# Patient Record
Sex: Female | Born: 1950 | Race: Black or African American | Hispanic: No | Marital: Married | Smoking: Never smoker
Health system: Southern US, Community
[De-identification: ages and names within clinical notes are randomized; demographics above are authoritative.]

## PROBLEM LIST (undated history)

## (undated) DIAGNOSIS — I10 Essential (primary) hypertension: Secondary | ICD-10-CM

## (undated) DIAGNOSIS — E119 Type 2 diabetes mellitus without complications: Secondary | ICD-10-CM

## (undated) HISTORY — DX: Type 2 diabetes mellitus without complications: E11.9

## (undated) HISTORY — PX: ABDOMINAL HYSTERECTOMY: SHX81

## (undated) HISTORY — DX: Essential (primary) hypertension: I10

---

## 1998-06-23 ENCOUNTER — Encounter: Admission: RE | Admit: 1998-06-23 | Discharge: 1998-09-21 | Payer: Self-pay | Admitting: Family Medicine

## 2000-05-02 ENCOUNTER — Ambulatory Visit (HOSPITAL_COMMUNITY): Admission: RE | Admit: 2000-05-02 | Discharge: 2000-05-02 | Payer: Self-pay | Admitting: Family Medicine

## 2000-05-02 ENCOUNTER — Encounter: Payer: Self-pay | Admitting: Family Medicine

## 2005-06-15 ENCOUNTER — Encounter: Admission: RE | Admit: 2005-06-15 | Discharge: 2005-06-15 | Payer: Self-pay | Admitting: Sports Medicine

## 2007-02-21 ENCOUNTER — Ambulatory Visit: Payer: Self-pay | Admitting: Surgery

## 2007-02-21 ENCOUNTER — Ambulatory Visit: Admission: RE | Admit: 2007-02-21 | Discharge: 2007-02-21 | Payer: Self-pay | Admitting: Orthopedic Surgery

## 2007-04-10 ENCOUNTER — Encounter: Admission: RE | Admit: 2007-04-10 | Discharge: 2007-04-10 | Payer: Self-pay | Admitting: Orthopedic Surgery

## 2011-02-05 ENCOUNTER — Ambulatory Visit (HOSPITAL_BASED_OUTPATIENT_CLINIC_OR_DEPARTMENT_OTHER)
Admission: RE | Admit: 2011-02-05 | Discharge: 2011-02-05 | Disposition: A | Discharge status: Home / Self Care | Payer: Worker's Compensation | Source: Ambulatory Visit | Attending: Emergency Medicine | Admitting: Emergency Medicine

## 2011-02-05 ENCOUNTER — Other Ambulatory Visit: Payer: Self-pay | Admitting: Emergency Medicine

## 2011-02-05 DIAGNOSIS — S0990XA Unspecified injury of head, initial encounter: Secondary | ICD-10-CM

## 2011-02-05 DIAGNOSIS — W19XXXA Unspecified fall, initial encounter: Secondary | ICD-10-CM

## 2011-02-05 DIAGNOSIS — S0003XA Contusion of scalp, initial encounter: Secondary | ICD-10-CM | POA: Insufficient documentation

## 2011-02-05 DIAGNOSIS — S1093XA Contusion of unspecified part of neck, initial encounter: Secondary | ICD-10-CM | POA: Insufficient documentation

## 2011-02-05 DIAGNOSIS — R51 Headache: Secondary | ICD-10-CM | POA: Insufficient documentation

## 2013-09-18 IMAGING — CT CT HEAD W/O CM
1 series · 16 of 30 positions shown, 20 images · non-contrast
Comparison: None.

CLINICAL DATA: Fall.  Head trauma.  Headache and scalp hematoma.

CT HEAD WITHOUT CONTRAST
TECHNIQUE: Contiguous axial images were obtained from the base of
the skull through the vertex without contrast.

[Series 2: head 4.8 h37s · axial · 0.47mm/px · z∈[-142,-9]mm · 16 of 32 slices shown, 20 images]
[im 2/32  brain]
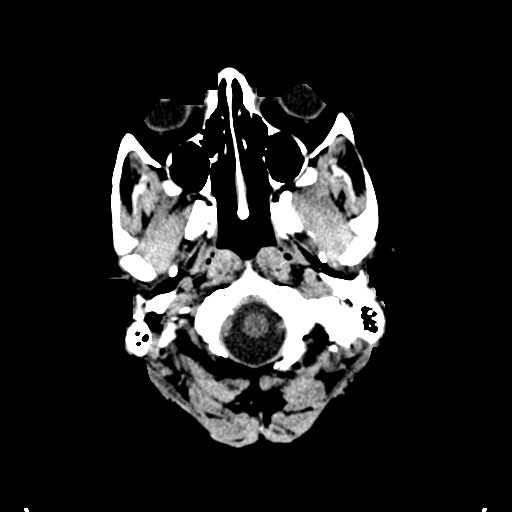
[im 2/32  bone]
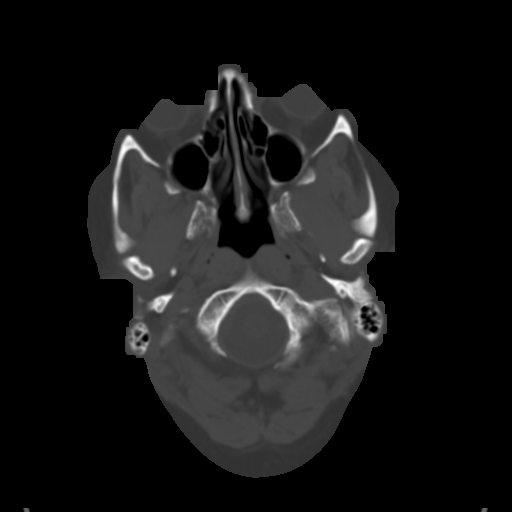
[im 4/32  brain]
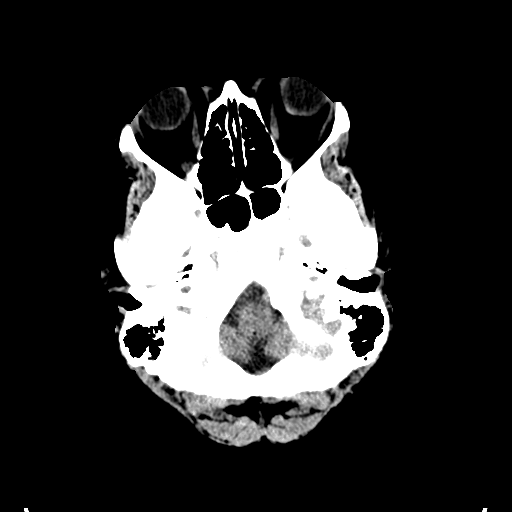
[im 6/32  brain]
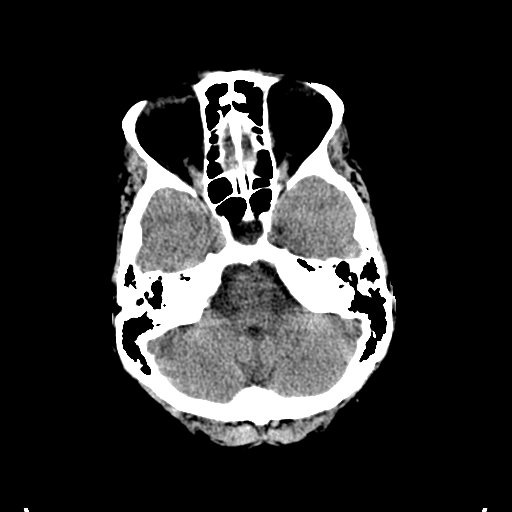
[im 8/32  brain]
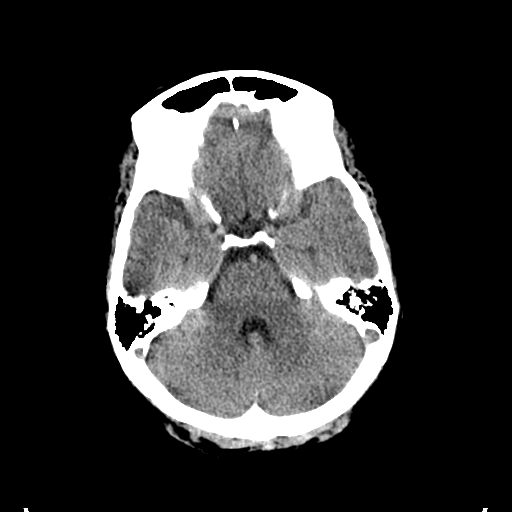
[im 9/32  brain]
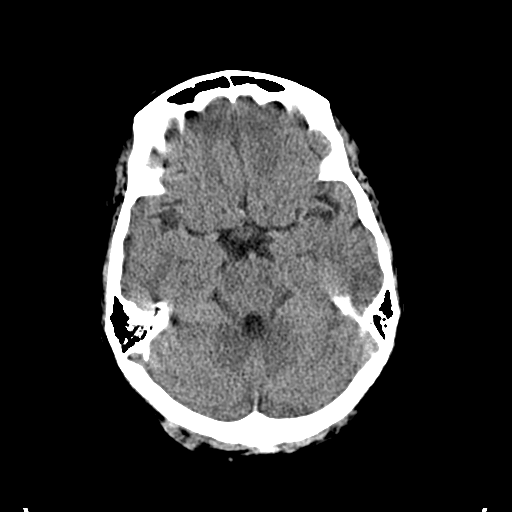
[im 9/32  bone]
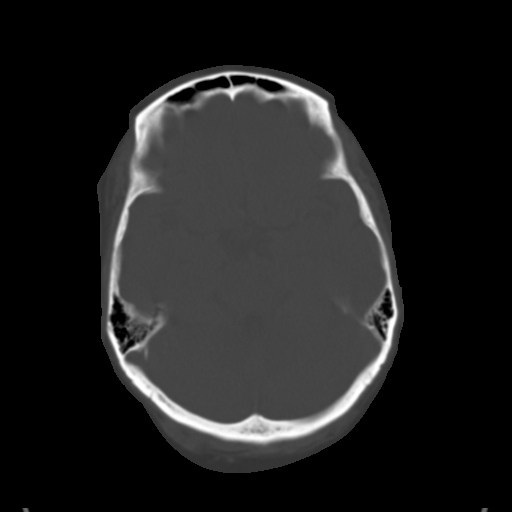
[im 11/32  brain]
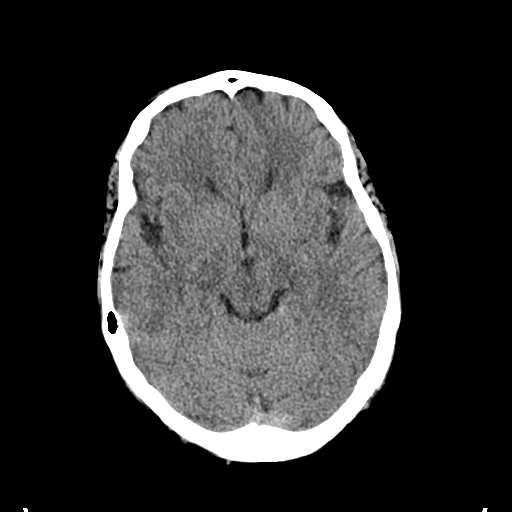
[im 13/32  brain]
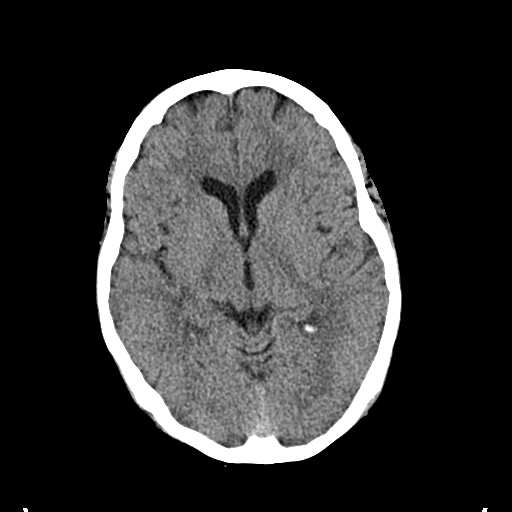
[im 15/32  brain]
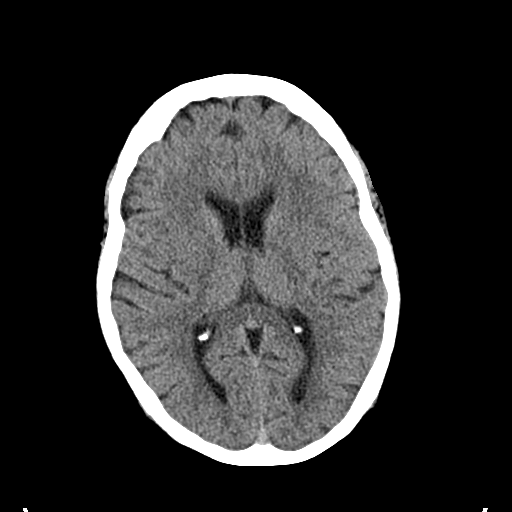
[im 17/32  brain]
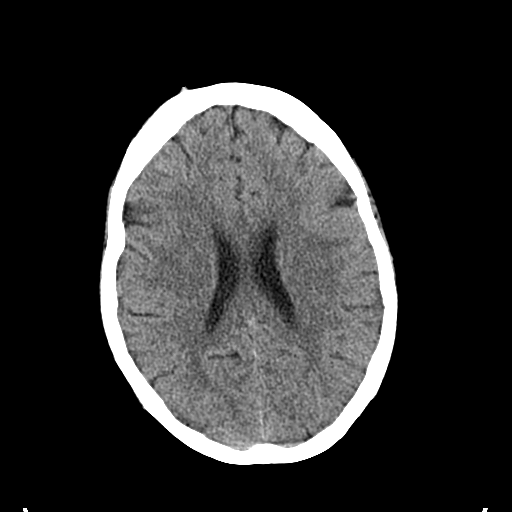
[im 17/32  bone]
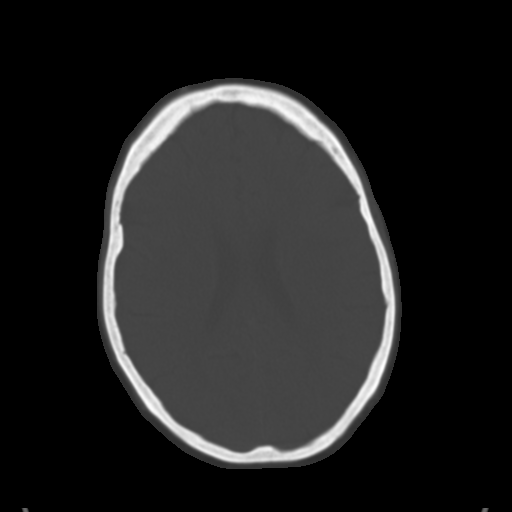
[im 19/32  brain]
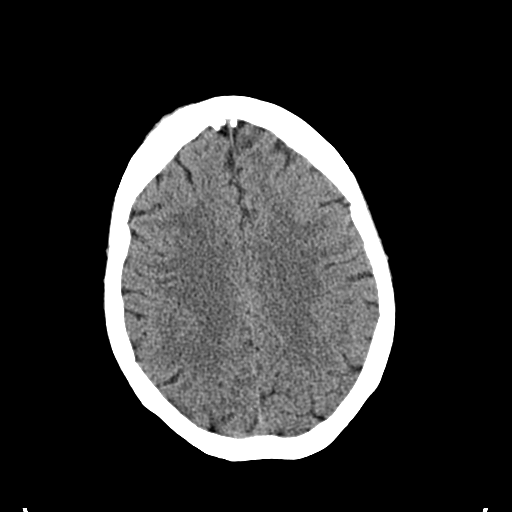
[im 21/32  brain]
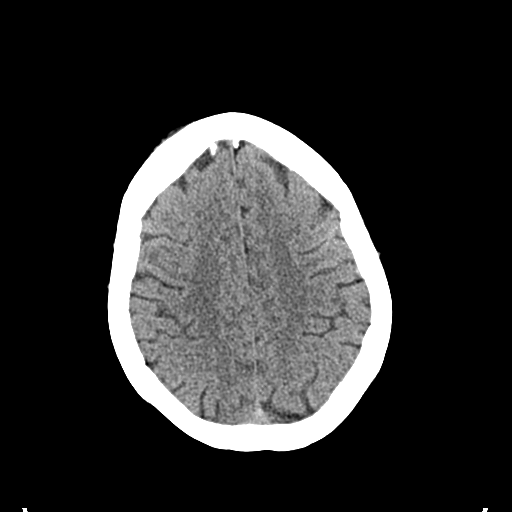
[im 23/32  brain]
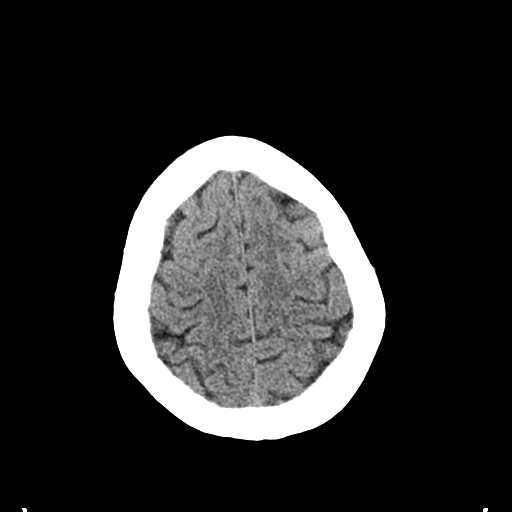
[im 24/32  brain]
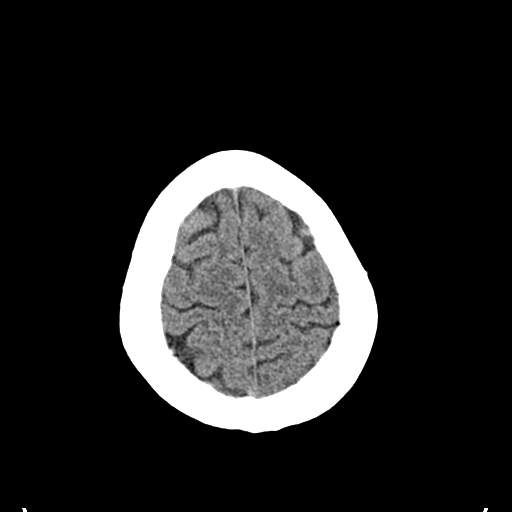
[im 24/32  bone]
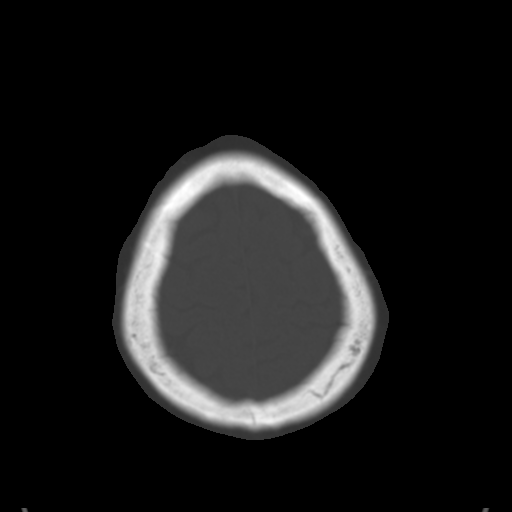
[im 26/32  brain]
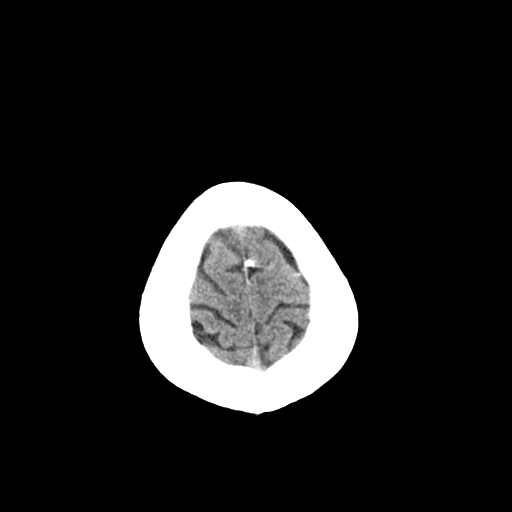
[im 28/32  brain]
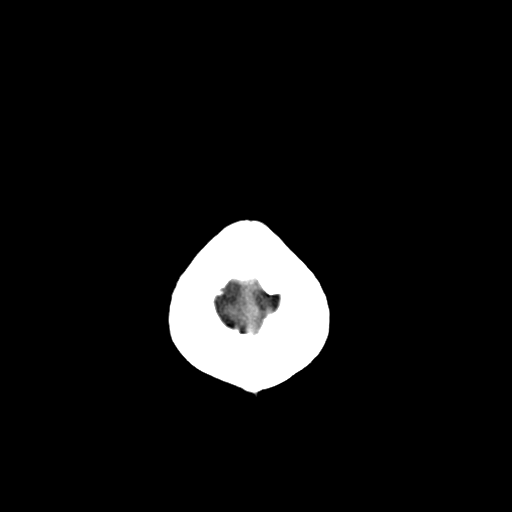
[im 30/32  brain]
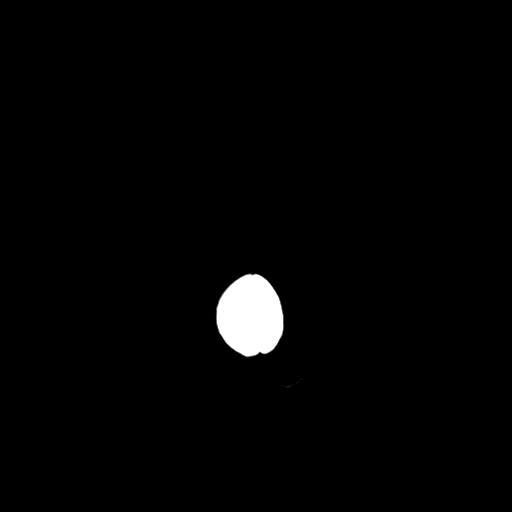

[16 of 30 positions shown; findings below may reference images not displayed]

FINDINGS: There is no evidence of intracranial hemorrhage, brain
edema or other signs of acute infarction.  There is no evidence of
intracranial mass lesion or mass effect.  No abnormal extra-axial
fluid collections are identified.

Ventricles are normal in size.  No evidence of skull fracture.
IMPRESSION: Negative noncontrast head CT.

## 2014-12-26 ENCOUNTER — Encounter: Payer: Self-pay | Admitting: Podiatry

## 2014-12-26 ENCOUNTER — Ambulatory Visit (INDEPENDENT_AMBULATORY_CARE_PROVIDER_SITE_OTHER): Payer: BLUE CROSS/BLUE SHIELD | Admitting: Podiatry

## 2014-12-26 ENCOUNTER — Ambulatory Visit (INDEPENDENT_AMBULATORY_CARE_PROVIDER_SITE_OTHER): Payer: BLUE CROSS/BLUE SHIELD

## 2014-12-26 VITALS — BP 129/79 | HR 72 | Resp 12

## 2014-12-26 DIAGNOSIS — E119 Type 2 diabetes mellitus without complications: Secondary | ICD-10-CM

## 2014-12-26 DIAGNOSIS — L600 Ingrowing nail: Secondary | ICD-10-CM | POA: Diagnosis not present

## 2014-12-26 MED ORDER — NEOMYCIN-POLYMYXIN-HC 3.5-10000-1 OT SOLN: OTIC | Status: DC

## 2014-12-26 NOTE — Progress Notes (Signed)
   Subjective:    Patient ID: Marie Franklin, female    DOB: 16-Sep-1950, 64 y.o.   MRN: [id number]  HPI: She presents today with chief complaint of ingrown toenail tibial border of the hallux right states been very sore for the past 3 weeks after having received a pedicure. She denies fever chills nausea vomiting muscle aches and pains and states her diabetes is under good control. She denies numbness and tingling to the toes. She has a history of cancer with chemotherapy.    Review of Systems  Musculoskeletal: Positive for joint swelling.       Objective:   Physical Exam: 64 year old female in no apparent distress vital signs stable alert and oriented 3 pulses are strong and palpable bilateral capillary fill time to digits 1 through 5 is immediate. Neurologic sensorium is intact percent sinus E monofilament. Muscle strength is 5 over 5 dorsiflexion plantar flexors and inverters and everters all of his musculature is intact. Orthopedic evaluation due to [facility] all joints distal to the ankle for range of motion without crepitation. Cutaneous evaluation demonstrates supple well-hydrated cutis no erythema edema cellulitis drainage or odor with resection of the sharp radial nail margin to the tibial border of the hallux right. There is also distal onychomycosis noted.        Assessment & Plan:  Assessment: Ingrown nail paronychia abscess tibial border hallux right.  Plan: Discussed etiology pathology conservative versus surgical therapies chemical matrixectomy was performed today to the tibial border of the hallux right. She tolerated this procedure well after local anesthesia was administered. She was given both oral and written home-going instructions for care and soaking of her toe as well as a prescription for Cortisporin Otic to be applied twice daily after soaking. I will follow-up with her in 1 week just to make sure she is doing well. If she has questions or concerns she will notify us  immediately.  Arbutus Ped DPM

## 2014-12-26 NOTE — Patient Instructions (Signed)

## 2014-12-30 ENCOUNTER — Telehealth: Payer: Self-pay | Admitting: *Deleted

## 2014-12-30 NOTE — Telephone Encounter (Signed)
Pt states she had an ingrown toenail procedure and is soaking 2 times day, but for how many days? I told pt to soak for 4-6 weeks, and to stop when the area got a dry, hard scab without redness, drainage.  Pt states she has a return appt, and I told her often at that time the doctor can tell about how much longer she should soak.

## 2015-01-02 ENCOUNTER — Ambulatory Visit (INDEPENDENT_AMBULATORY_CARE_PROVIDER_SITE_OTHER): Payer: BLUE CROSS/BLUE SHIELD | Admitting: Podiatry

## 2015-01-02 ENCOUNTER — Encounter: Payer: Self-pay | Admitting: Podiatry

## 2015-01-02 DIAGNOSIS — L600 Ingrowing nail: Secondary | ICD-10-CM

## 2015-01-02 NOTE — Patient Instructions (Signed)

## 2015-01-02 NOTE — Progress Notes (Signed)
She presents today 1 week status post matrixectomy hallux right. She states that she continues to soak twice daily and Betadine water and apply Cortisporin Otic as directed. She states that she is tired of doing this and ready to stop. She states that she is about to go on a trip and does not want have to soak while on that trip.  Objective: Her signs are stable she is alert and oriented 3. Pulses are palpable. No erythema edema cellulitis drainage or odor to the tibial border of the hallux right this appears to be healing in very nicely. All the tender on palpation.  Assessment: Well-healing matrixectomy site tibial border hallux right.  Plan: Chemical matrixectomy was performed last week and doing very well at this point. Discontinue Betadine sterile Epsom salts and water soaks covered in the day and leave open at bedtime. She's continue soaks until there is no pain no redness and no drainage.  Marie Franklin DPM

## 2015-01-21 ENCOUNTER — Ambulatory Visit (INDEPENDENT_AMBULATORY_CARE_PROVIDER_SITE_OTHER): Payer: BLUE CROSS/BLUE SHIELD | Admitting: Podiatry

## 2015-01-21 ENCOUNTER — Encounter: Payer: Self-pay | Admitting: Podiatry

## 2015-01-21 VITALS — BP 133/87 | HR 84 | Resp 16

## 2015-01-21 DIAGNOSIS — L600 Ingrowing nail: Secondary | ICD-10-CM

## 2015-01-21 DIAGNOSIS — L03011 Cellulitis of right finger: Secondary | ICD-10-CM

## 2015-01-21 DIAGNOSIS — L03031 Cellulitis of right toe: Secondary | ICD-10-CM

## 2015-01-21 MED ORDER — AMOXICILLIN-POT CLAVULANATE 875-125 MG PO TABS: 1.0000 | ORAL_TABLET | Freq: Two times a day (BID) | ORAL | Status: DC

## 2015-01-21 NOTE — Progress Notes (Signed)
She presents today for follow-up of matrixectomy tibial border hallux right. She states is still a little oozing and sore particularly when I press on it.  Objective: Vital signs are stable she is alert and oriented 3. Continues to soak in Epsom salts and warm water twice a day covering with a Band-Aid. There is mild erythema along the tibial border and along the proximal nail fold. There is mild erythema with some mild serosanguineous type drainage.  Assessment: Mild cellulitis paronychia hallux right.  Plan: Discussed etiology pathology conservative versus surgical therapies. Started her on Augmentin 875 one by mouth twice a day. I will follow up with her in 2 weeks.  Arbutus Pedodd Hansel Devan DPM

## 2015-01-21 NOTE — Patient Instructions (Signed)
Epsom Salt Soak Instructions   Place 1/4 cup of epsom salts in a quart of warm tap water.  Soak your foot or feet in the solution for 20 minutes twice a day until you notice the area has dried and a scab has formed. Continue to apply other medications to the area as directed by the doctor such as polysporin, neosporin or cortisporin drops.  IF YOUR SKIN BECOMES IRRITATED WHILE USING THESE INSTRUCTIONS, IT IS OKAY TO SWITCH TO  WHITE VINEGAR AND WATER. Or you may use antibacterial soap and water to keep the toe clean  Monitor for any signs/symptoms of infection. Call the office immediately if any occur or go directly to the emergency room. Call with any questions/concerns. 

## 2015-02-04 ENCOUNTER — Encounter: Payer: Self-pay | Admitting: Podiatry

## 2015-02-04 ENCOUNTER — Ambulatory Visit (INDEPENDENT_AMBULATORY_CARE_PROVIDER_SITE_OTHER): Payer: BLUE CROSS/BLUE SHIELD | Admitting: Podiatry

## 2015-02-04 DIAGNOSIS — L03031 Cellulitis of right toe: Secondary | ICD-10-CM

## 2015-02-04 DIAGNOSIS — L03011 Cellulitis of right finger: Secondary | ICD-10-CM

## 2015-02-04 NOTE — Progress Notes (Signed)
She presents today for follow-up matrixectomy hallux right. She states that the matrixectomy part seems to be doing fine however the toenail itself hurts when it is pressed upon. She denies fever chills nausea vomiting muscle aches and pains.  Objective: Vital signs are stable she is alert and oriented 3 pulses are palpable. Thick dystrophic nail with no erythema saline as drainage or odor.  Assessment: Well-healing matrixectomy with thick dystrophic nail.  Plan: Debrided the thickness of the nail today and suggested that she can discontinue soaking the hallux right. Follow-up with me as needed for routine thinning of the nail.

## 2015-09-04 ENCOUNTER — Encounter: Payer: Self-pay | Admitting: Podiatry

## 2015-09-04 ENCOUNTER — Ambulatory Visit (INDEPENDENT_AMBULATORY_CARE_PROVIDER_SITE_OTHER): Payer: Medicare Other | Admitting: Podiatry

## 2015-09-04 VITALS — BP 125/74 | HR 71 | Resp 16

## 2015-09-04 DIAGNOSIS — L600 Ingrowing nail: Secondary | ICD-10-CM

## 2015-09-04 MED ORDER — AMOXICILLIN-POT CLAVULANATE 875-125 MG PO TABS: 1.0000 | ORAL_TABLET | Freq: Two times a day (BID) | ORAL | Status: DC

## 2015-09-04 MED ORDER — NEOMYCIN-POLYMYXIN-HC 3.5-10000-1 OT SOLN: OTIC | Status: AC

## 2015-09-04 NOTE — Patient Instructions (Signed)

## 2015-09-04 NOTE — Progress Notes (Signed)
She presents today with chief complaint of infection to the proximal border with drainage tibial border hallux right for matrixectomy was performed several months ago. She states that he was doing good since November but has recently started turn red and become painful again.  Objective: Vital signs are stable she is alert and oriented 3 there is proximal drainage on the proximal medial border of the hallux right. There is some postinflammatory hyperpigmentation in this area as well. There does appear to be some some incurvation of the nail plate.  Assessment: She does have an ingrown toenail with a mild paronychia and possible abscess proximally.  Plan: Chemical he takes ectomy was performed today after local anesthesia was administered. She tolerated this procedure well we removed all necrotic tissue including granulation tissue and abscess proximally. I placed her on Augmentin and neomycin 4 drops afterwards. She was given both oral and written instructions for care and soaking of her toe she will start soaking tomorrow I will follow-up with her in 1 week

## 2015-09-05 ENCOUNTER — Telehealth: Payer: Self-pay | Admitting: *Deleted

## 2015-09-05 NOTE — Telephone Encounter (Signed)
Pt asked when she could shower, she had soaked the toe this morning. I informed pt she would be able to shower at any time but we generally instructed pt to shower prior to a soak.  Pt states so she could shower before her next soak.

## 2015-09-16 ENCOUNTER — Ambulatory Visit (INDEPENDENT_AMBULATORY_CARE_PROVIDER_SITE_OTHER): Payer: Medicare Other | Admitting: Podiatry

## 2015-09-16 ENCOUNTER — Encounter: Payer: Self-pay | Admitting: Podiatry

## 2015-09-16 DIAGNOSIS — L600 Ingrowing nail: Secondary | ICD-10-CM

## 2015-09-16 MED ORDER — AMOXICILLIN-POT CLAVULANATE 875-125 MG PO TABS: 1.0000 | ORAL_TABLET | Freq: Two times a day (BID) | ORAL | Status: AC

## 2015-09-16 NOTE — Progress Notes (Signed)
She presents today for follow-up of matrixectomy tibial border hallux right. She states that she is doing well she continues to site and Betadine and warm water.  Objective: Vital signs are stable she is alert and oriented 3. There is no erythema edema and cellulitis drainage or odor. Margin appears to be well healing well without complication. No signs of infection.  Assessment: Well-healing surgical foot hallux right.  Plan: Continue soak Epsom salts and water and to completely resolve. I also refilled her antibiotic.

## 2015-09-16 NOTE — Patient Instructions (Signed)
